# Patient Record
Sex: Female | Born: 1950 | Race: White | Hispanic: No | Marital: Married | Smoking: Never smoker
Health system: Southern US, Community
[De-identification: ages and names within clinical notes are randomized; demographics above are authoritative.]

## PROBLEM LIST (undated history)

## (undated) DIAGNOSIS — J189 Pneumonia, unspecified organism: Secondary | ICD-10-CM

## (undated) DIAGNOSIS — I10 Essential (primary) hypertension: Secondary | ICD-10-CM

## (undated) HISTORY — PX: TUBAL LIGATION: SHX77

## (undated) HISTORY — PX: VIDEO ASSISTED THORACOSCOPY (VATS)/DECORTICATION: SHX6171

---

## 1999-06-06 ENCOUNTER — Other Ambulatory Visit: Admission: RE | Admit: 1999-06-06 | Discharge: 1999-06-06 | Payer: Self-pay | Admitting: Internal Medicine

## 2000-09-18 ENCOUNTER — Encounter: Admission: RE | Admit: 2000-09-18 | Discharge: 2000-09-18 | Payer: Self-pay | Admitting: Internal Medicine

## 2000-09-18 ENCOUNTER — Encounter: Payer: Self-pay | Admitting: Internal Medicine

## 2000-10-23 ENCOUNTER — Other Ambulatory Visit: Admission: RE | Admit: 2000-10-23 | Discharge: 2000-10-23 | Payer: Self-pay | Admitting: *Deleted

## 2005-04-10 ENCOUNTER — Inpatient Hospital Stay (HOSPITAL_COMMUNITY): Admission: EM | Admit: 2005-04-10 | Discharge: 2005-05-05 | Payer: Self-pay | Admitting: Emergency Medicine

## 2005-04-12 ENCOUNTER — Encounter (INDEPENDENT_AMBULATORY_CARE_PROVIDER_SITE_OTHER): Payer: Self-pay | Admitting: Interventional Cardiology

## 2005-04-13 ENCOUNTER — Ambulatory Visit: Payer: Self-pay | Admitting: Internal Medicine

## 2005-04-14 ENCOUNTER — Encounter (INDEPENDENT_AMBULATORY_CARE_PROVIDER_SITE_OTHER): Payer: Self-pay | Admitting: *Deleted

## 2005-04-21 ENCOUNTER — Encounter (INDEPENDENT_AMBULATORY_CARE_PROVIDER_SITE_OTHER): Payer: Self-pay | Admitting: Specialist

## 2005-04-23 ENCOUNTER — Encounter (INDEPENDENT_AMBULATORY_CARE_PROVIDER_SITE_OTHER): Payer: Self-pay | Admitting: *Deleted

## 2005-05-25 ENCOUNTER — Encounter: Admission: RE | Admit: 2005-05-25 | Discharge: 2005-05-25 | Payer: Self-pay | Admitting: Thoracic Surgery

## 2005-06-23 ENCOUNTER — Encounter: Admission: RE | Admit: 2005-06-23 | Discharge: 2005-06-23 | Payer: Self-pay | Admitting: Thoracic Surgery

## 2005-09-05 ENCOUNTER — Encounter: Admission: RE | Admit: 2005-09-05 | Discharge: 2005-09-05 | Payer: Self-pay | Admitting: Thoracic Surgery

## 2006-11-06 IMAGING — CT CT ANGIO CHEST
1 of 6 series · 14 of 30 positions shown · IV contrast (120 ML OMNI 300)
Comparison: none

CLINICAL DATA: Shortness of breath.  Chest pain.  Question pulmonary embolus.
 CT CHEST ANGIO PULMONARY EMBOLUS PROTOCOL:
 Multidetector helical study performed during IV contrast enhancement with 120 cc of Omnipaque 300.  Coronal and sagittal reformations were performed.  There are extensive bilateral infiltrative changes, particularly involving the lower lobes (left greater than right).  In addition, there is a left pleural effusion present with a smaller right pleural effusion, and there are areas of probable loculation associated with the pleural effusions bilaterally.  There is also a 1.6 cm in size nodule seen within the posterior right lower lobe (image #55).  I do recommend follow-up chest CT scan after treatment with attention to this area.  This may represent either a nodular infiltrate or a tumor.  There is also a smaller (5 mm) nodule within the right upper lobe in a subpleural location (image #32).  This should also be re-examined on subsequent chest CT.  There is no CT scan evidence for pulmonary emboli.  There is no mediastinal or hilar adenopathy.

[Series 2: pe · axial · 0.70mm/px · z∈[-214,-17]mm · 14 of 370 slices shown]
[im 27/370  lung]
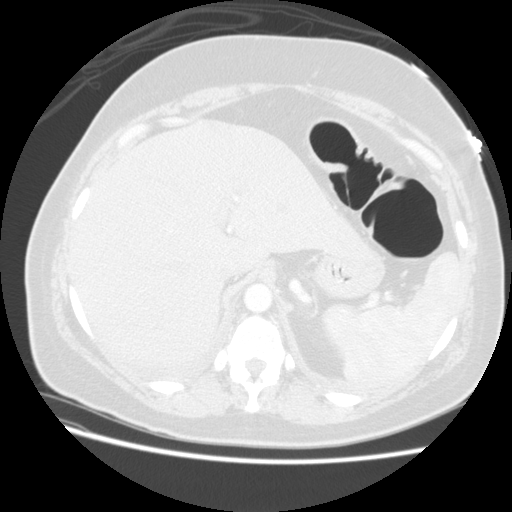
[im 53/370  mediastinal]
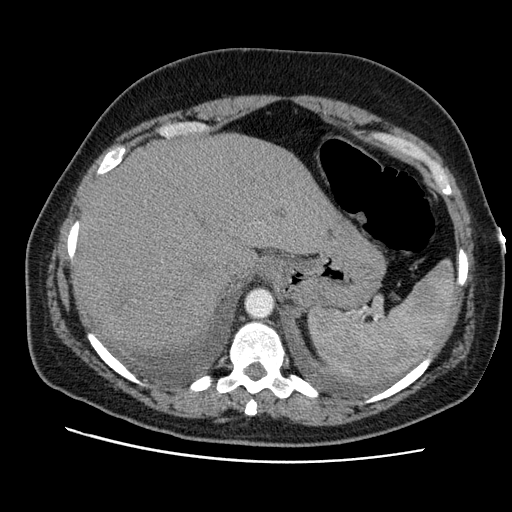
[im 80/370  lung]
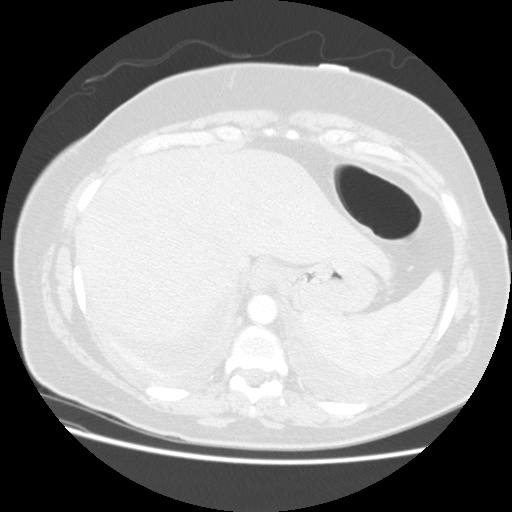
[im 106/370  mediastinal]
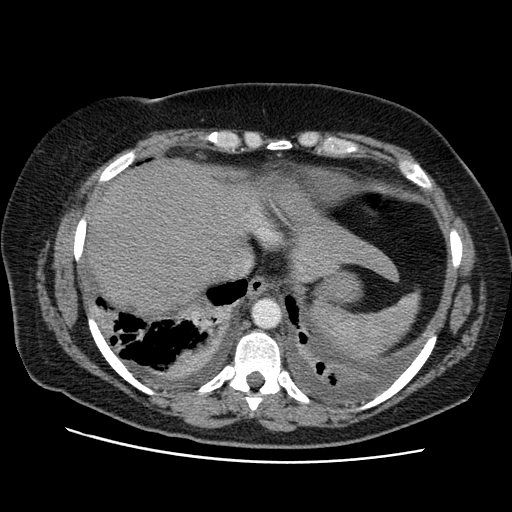
[im 132/370  lung]
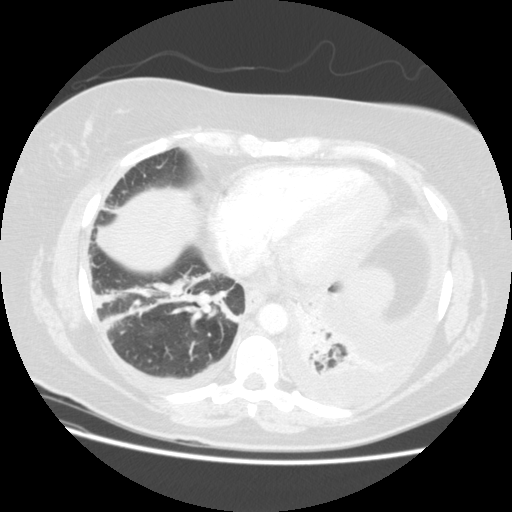
[im 159/370  mediastinal]
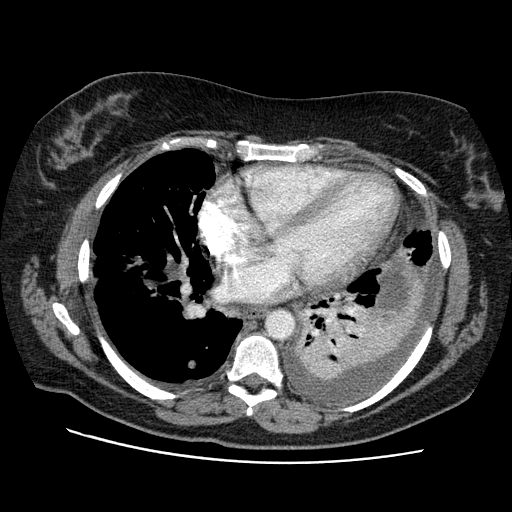
[im 174/370  lung]
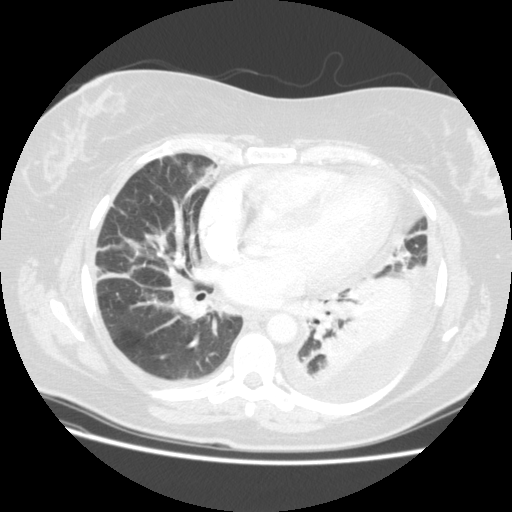
[im 185/370  mediastinal]
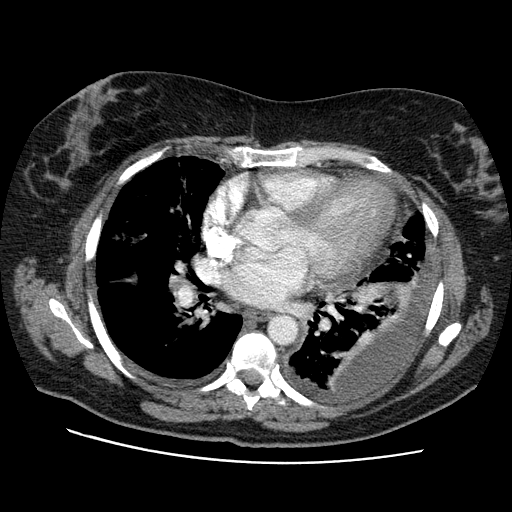
[im 211/370  lung]
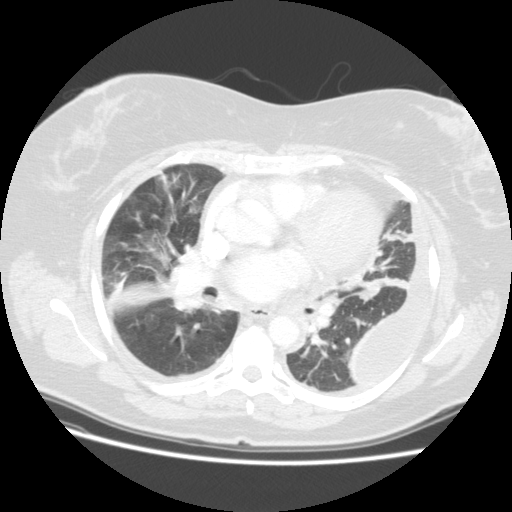
[im 238/370  mediastinal]
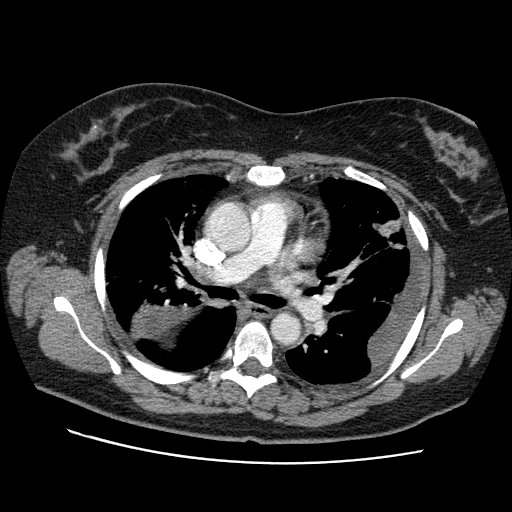
[im 264/370  lung]
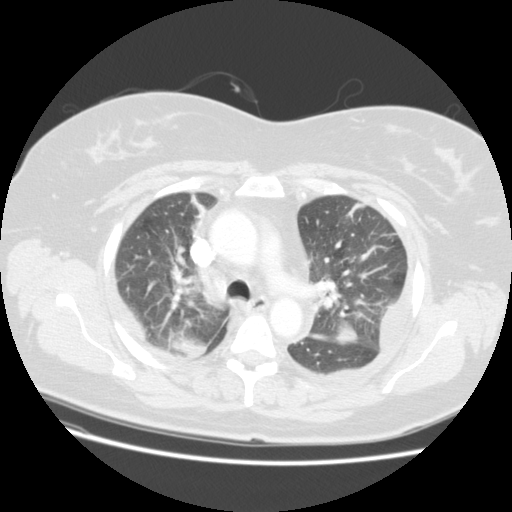
[im 290/370  mediastinal]
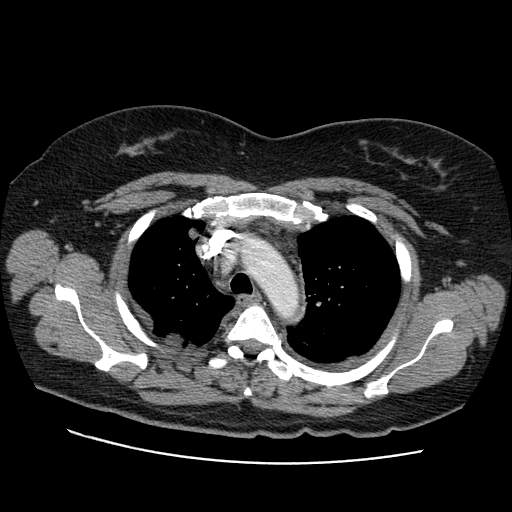
[im 317/370  lung]
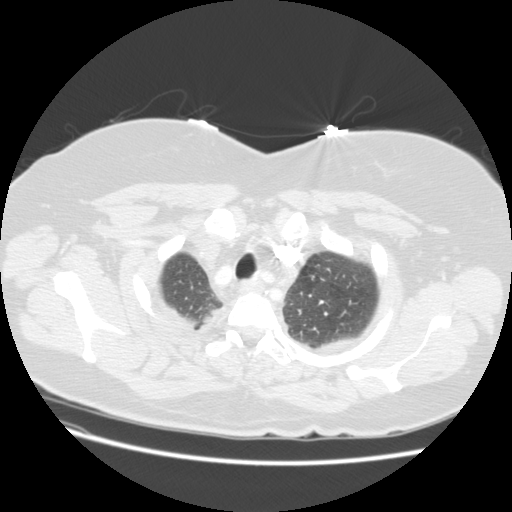
[im 343/370  mediastinal]
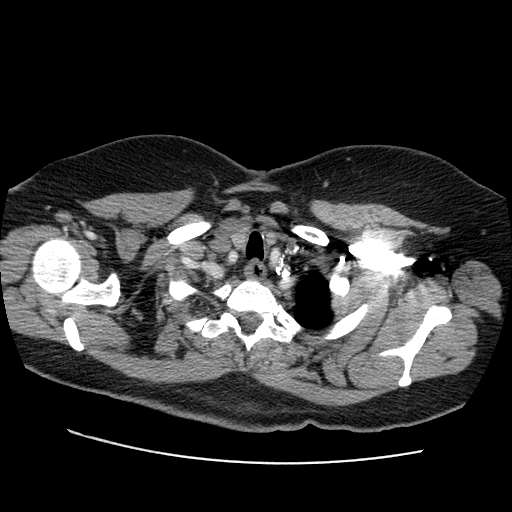

[14 of 30 positions shown; findings below may reference images not displayed]

IMPRESSION: 1.  No CT scan evidence for pulmonary emboli.
 2.  Extensive bilateral infiltrates, particularly involving the left lower lobe.
 3.  1.6 cm in size right lower lobe nodule and 5 mm in size subpleural right upper lobe nodule.  These areas should be followed up following treatment on subsequent chest CT scans.
 4.  Bilateral pleural effusions with probable areas of loculation.

## 2008-07-16 ENCOUNTER — Other Ambulatory Visit: Admission: RE | Admit: 2008-07-16 | Discharge: 2008-07-16 | Payer: Self-pay | Admitting: Internal Medicine

## 2010-02-01 ENCOUNTER — Other Ambulatory Visit: Admission: RE | Admit: 2010-02-01 | Discharge: 2010-02-01 | Payer: Self-pay | Admitting: Internal Medicine

## 2010-09-07 ENCOUNTER — Encounter: Admission: RE | Admit: 2010-09-07 | Discharge: 2010-09-07 | Payer: Self-pay | Admitting: Internal Medicine

## 2010-12-17 ENCOUNTER — Encounter: Payer: Self-pay | Admitting: Thoracic Surgery

## 2010-12-18 ENCOUNTER — Encounter: Payer: Self-pay | Admitting: Thoracic Surgery
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
## 2011-04-14 NOTE — Consult Note (Signed)
NAME:  [name], RIEKE NO.:  [id number]   MEDICAL RECORD NO.:  [id number]          PATIENT TYPE:  INP   LOCATION:  6704                         FACILITY:  [facility]   PHYSICIAN:  Meade Maw, M.D.    DATE OF BIRTH:  Jul 02, 1951   DATE OF CONSULTATION:  05/03/2005  DATE OF DISCHARGE:                                   CONSULTATION   PRIMARY CARE PHYSICIAN:  Dr. Robley Fries.   INDICATION FOR STUDY:  Evaluation of MRSA for possible bacterial  endocarditis.   HISTORY:  [name] is a 60 year old unfortunate female who was admitted on May  15th with complaints of pain and fever and found to have a labial cyst  infection.  She was started on Augmentin as an outpatient, had nausea and  vomiting and was admitted for a possible aspiration pneumonia.  She  subsequently developed empyema requiring drainage for CVTS.  She developed  thrombocytopenia secondary to Zosyn and now has been found to develop MRSA  bacteremia.  Her last transthoracic echo on Apr 12, 2005, interpreted by Dr.  Verdis Prime, was suboptimal.  There was grossly normal hemodynamic function  with an ejection fraction of 65-75%, unable to comment on valvular  endocarditis.   PAST MEDICAL HISTORY:  Includes:  1.  Morbid obesity.  2.  Adult onset diabetes, new this admission.  3.  Asthma.  4.  Acute renal failure, new this admission.  5.  Thrombocytopenia secondary to Zosyn.  6.  Bilateral empyema status post VATS.  7.  Vaginal labial abscess as noted above.   REVIEW OF SYSTEM:  Is as noted above.   ALLERGIES:  NO KNOWN DRUG ALLERGIES.   SOCIAL HISTORY:  She is married, works in [employer].  No history of alcohol or  tobacco use.   INPATIENT MEDICATIONS INCLUDE:  1.  Sliding scale insulin.  2.  Nexium 40 mg daily.  3.  __________ supplement.  4.  Vancomycin IV.  5.  Dulcolax suppository.  6.  Aranesp.   PHYSICAL EXAM:  Reveals a middle-aged female currently in no distress,  alert, oriented,  conversation is appropriate.  She is afebrile.  Systolic  pressure running 170-180/60, heart rate 86, O2 sats ranging 85-89% on room  air.  PULMONARY EXAM:  Reveals breath sounds which are slightly diminished at the  base, no wooziness noted.  CARDIOVASCULAR EXAM:  Reveals a regular rate and rhythm, no audible murmur  is noted, no bruits are noted.  ABDOMEN:  Soft, nontender.  EXTREMITIES:  Reveal no peripheral edema, distal pulses are equal and  palpable.   LABORATORY DATA:  White count was 12.6, hematocrit 28.1, platelet count 226,  sodium 144, potassium 3.5, creatinine is 3.2 and has decreased from 5.4.  ECG reveals low voltage, slight R wave progression.  There is nonspecific ST  changes.  Blood culture was positive for MSRA as noted above.   IMPRESSION:  1.  Staphylococcus bacteremia.  Will schedule for a transesophageal      echocardiogram, this has been scheduled for May 05, 2005, at 2 p.m.,      this is the first  slot available.  She may have a clear liquid breakfast      at 6 a.m., but otherwise n.p.o.  2.  Hypertension, blood pressure currently is not well controlled.  May need      to consider adding a calcium channel blocker, would avoid Ace inhibitors      at this time in view of renal insufficiency.  3.  Acute renal insufficiency, creatinine is gradually improving.       HP/MEDQ  D:  05/03/2005  T:  05/03/2005  Job:  [id number]

## 2011-04-14 NOTE — H&P (Signed)
NAME:  YANELLE, SOUSA NO.:  [id number]   MEDICAL RECORD NO.:  [id number]          PATIENT TYPE:  INP   LOCATION:  3729                         FACILITY:  [facility]   PHYSICIAN:  Candyce Churn, M.D.DATE OF BIRTH:  [birth year]   DATE OF ADMISSION:  04/10/2005  DATE OF DISCHARGE:                                HISTORY & PHYSICAL   CHIEF COMPLAINT:  Chest pain and fever.   HISTORY OF PRESENT ILLNESS:  Ms. Kiger is a pleasant 60 year old female  with a history of (1) mild obesity, (2) hypercholesterolemia, (3) seasonal  allergies.  She presents with a 2-day history of nausea and vomiting after  having started Augmentin and oxycodone and Tylenol for pain and fever, felt  to be secondary to an infected left labial cyst in the genital area.   The patient had developed some pain and swelling on Thursday of last week,  and on Friday had the left vaginal labial cyst drained, and was started on  Augmentin and the oxycodone with Tylenol.  She developed nausea and vomiting  following this, multiple times the day before yesterday.  Because she was  not eating well and she continued to be febrile, she went to the  [facility] walk-in clinic today, and then was transferred to [facility]  emergency room for abnormal chest x-ray and borderline hypoxia.  She is also  having intense pleuritic chest pain in her anterior chest, right greater  than left.  O2 saturations on room air have been 90%.  Chest x-ray is  consistent with infiltrates bilaterally in the lower lobes.   CURRENT MEDICATIONS:  1.  Oxycodone/APAP 10/325 one-half to one every 4 hours as needed.  2.  Augmentin XR 1000/62.5 mg one p.o. b.i.d.   ALLERGIES:  No known drug allergies.   FAMILY HISTORY:  Significant for hypertension, diabetes, COPD, and colon  cancer.   SOCIAL HISTORY:  The patient is married and works in the lab at __________.  No tobacco use.  No alcohol use.  Husband is present and very  supportive -  Zuriah Bordas, who is also a patient of mine.   REVIEW OF SYSTEMS:  Denies headache.  She does have rather intense pleuritic  chest pain.  Denies any choking with her nausea or vomiting over the  weekend.  No abdominal pain currently.  No bright red blood per rectum.  The  patient is currently having her period, which is the first time she has had  it in 6 months.  Extremities without pain or swelling.   PHYSICAL EXAMINATION:  GENERAL:  She is alert and oriented, but complaining  of pain with inspiration in her anterior chest.  VITAL SIGNS:  Blood pressure is 138/64, temperature 99.2 after Tylenol,  pulse 98 and regular, respiratory rate 24 and hindered by pain with  respiration.  She is 99% on two liters of oxygen nasal cannula in terms of  her O2 saturation.  HEENT:  Atraumatic and normocephalic.  Oropharynx is clear.  Mucous  membranes are only minimally moist at best.  No posterior injection.  NECK:  Supple without JVD or thyromegaly.  Carotids were 2+ bilaterally.  No  lymphadenopathy noted.  CHEST:  Coarse breath sounds bilaterally, but no obvious rales present.  CARDIAC:  Regular rhythm.  No murmurs or gallops.  ABDOMEN:  Soft, obese, nontender.  Bowel sounds are normal.  PELVIC:  Left labial majora has a small area of exudate, approximately 0.5 x  0.5 cm, along the mid labia.  There is minimal erythema and fluctuance at  best.  Really, the left labia majora is about the same size as the right.  Other than a small spot of purulence, it looked relatively normal.  The  patient states that it is much improved.  EXTREMITIES:  Without clubbing, cyanosis, or edema.  There are no rashes  diffusely.   Chest x-ray reveals bilateral infiltrates and elevated diaphragm, left  greater than right.  EKG reveals sinus tachycardia at 104.  No ST segment  elevation or depression.  No right axis deviation.  LFT's are normal, except  for total bilirubin elevated at 1.6.  Glucose is  113, creatinine 1.2, BUN  27.  CBC reveals a white count of 14,100, hemoglobin 14, and platelet count  of 165,000.   ASSESSMENT:  A 60 year old female with bilobar infiltrates in the left lower  lobe and fever.  Wonder if this may be aspiration with nausea and vomiting  over the weekend.  Also worried about bilateral pulmonary emboli.   PLAN:  1.  Will check D-dimers, and if elevated, will check chest CT.  2.  For community acquired bacterial pneumonia, will cover with IV Rocephin      and azithromycin, and treat with nebulizer therapy, IV fluids, and nasal      cannula oxygen to keep O2 saturations above 90%.  3.  Will go ahead and start Lovenox with a one-time dose of 80 mg      subcutaneous of Lovenox, and if pulmonary embolus is ruled out, will      continue subcutaneous Lovenox 40 mg daily until the patient is more      ambulatory and active.       RNG/MEDQ  D:  04/10/2005  T:  04/10/2005  Job:  [id number]

## 2011-04-14 NOTE — Op Note (Signed)
[name], [name] NO.:  [id number]   MEDICAL RECORD NO.:  [id number]          PATIENT TYPE:  INP   LOCATION:  2550                         FACILITY:  [facility]   PHYSICIAN:  Ines Bloomer, M.D. DATE OF BIRTH:  October 13, 1951   DATE OF PROCEDURE:  04/21/2005  DATE OF DISCHARGE:                                 OPERATIVE REPORT   PREOPERATIVE DIAGNOSIS:  Bilateral empyemas.   POSTOPERATIVE DIAGNOSIS:  Bilateral empyemas.   OPERATION PERFORMED:  Left and right video-assisted video thoracoscopy,  drainage of empyema, mini-thoracotomy, decortication, and lung biopsy.   Under general anesthesia, the patient had a dual-lumen tube inserted, was  turned in the left lateral thoracotomy position, was prepped and draped in  the usual sterile manner.  Two trocar sites were made in the anterior and  posterior axillary line at the eighth intercostal space.  Two trocars were  inserted and the empyema cavity was entered.  There was a marked amount of  reaction around the left lower lobe.  Pictures were taken and cultures were  taken, but the intense reaction required a mini-thoracotomy so a 4-5 cm  incision was made and the sixth intercostal space was entered.  After  partially dividing the latissimus, a small retractor was inserted.  We freed  up the left upper lobe and then freed up the left lower lobe with sharp and  blunt dissection.  The fissure was then freed up.  We had to partially  divide the fissure with an EZ-45 stapler.  After this had been done, in the  lingula of the left upper lobe there was some peel, and this was removed  with sharp and blunt dissection using Kitners and forceps to remove the  peel.  Then the attention was turned to the left upper lobe and the peel was  really in the basilar segments.  The superior segments appeared to be  satisfactory and several peels of the left upper lobe were done.  The  decortication of the left lower lobe was done,  removing the thickened peel.  However, in the medial basilar segment there was an obvious abscess and this  was resected with the Echelon 60 stapler and the EZ-45 stapler.  A frozen  section revealed an abscess and not cancer.  The rest of the peel was  removed from the left lower lobe and all necrotic debris was removed from  the pleura, and all thickened pleura was removed.  Three chest tubes were  placed, two to the trocar sites, anterior and posterior 28 chest tubes, and  then a right angle chest tube was placed between these and sutured in place  with 0 silk.  The right angle chest tube was placed to the diaphragm.  On-Q  catheters were placed in the intercostal space, and the intercostal space  was closed with two paracostals of #1 Vicryl in the muscle layers, 2-0  Vicryl in the subcutaneous tissue and Ethicon skin clips.  The patient was  then turned to the left lateral thoracotomy position and prepped and draped  in the usual sterile manner.  The lung was  deflated.  Two trocar sites were  made in the anterior and posterior axillary line at the eighth intercostal  space.  Two trocars were inserted, and again there was a marked peel on the  left lower lobe, but this involved the posterior basilar segments.  There  was some mild involvement of the medial basilar segments.  In the sixth  intercostal space, a 3-4 cm incision was made and a small retractor was  inserted, and the right lower lobe was freed up and a culture sent from the  fluid as well as from the debris, and all debris was removed from the  pleura, particularly in the right posterior costophrenic angle.  After all  necrotic debris had been removed, decortication was done of the right lower  lobe, removing the thickened peel with  sharp and blunt dissection.  Three chest tubes were then placed, two  straight 28's and a right angle 28, and the chest was closed with two  paracostals of #1 Vicryl in the muscle layer and  Ethicon skin clips.  The  patient returned to the recovery room in serious condition.      DPB/MEDQ  D:  04/21/2005  T:  04/22/2005  Job:  [id number]   cc:   Shan Levans, M.D. [facility]   Candyce Churn, M.D.  [address] [place]  [place] [address]  Fax: [phone]

## 2011-04-14 NOTE — Discharge Summary (Signed)
[name], [name] NO.:  [id number]   MEDICAL RECORD NO.:  [id number]          PATIENT TYPE:  INP   LOCATION:  6704                         FACILITY:  [facility]   PHYSICIAN:  Candyce Churn, M.D.DATE OF BIRTH:  Apr 14, 1951   DATE OF ADMISSION:  04/10/2005  DATE OF DISCHARGE:  05/05/2005                                 DISCHARGE SUMMARY   DISCHARGE DIAGNOSES:  1.  Bilateral aspiration pneumonia with bilateral empyemas.  2.  Bilateral chest tube placement per Dr. Karle Plumber for treatment of      empyema.  3.  Methicillin resistant staph aureus sepsis with need for post hospital      intravenous vancomycin therapy for 4 weeks.  4.  Acute renal failure following bilateral video-assisted thoracic surgery      for treatment of empyema, resolved.  5.  Hypokalemia, resolved.  6.  Aortic sclerosis.  7.  Thrombocytopenia secondary to Zosyn.  8.  Asthma.  9.  Pre-hospital drainage of a vaginal labial abscess.   CONSULTATIONS:  1.  Meade Maw, M.D. to rule out methicillin resistant staph aureus      bacterial endocarditis.  2.  D. Karle Plumber, M.D. for bilateral empyemas with subsequent video-      assisted thoracic surgery procedure on both lungs.  3.  Beryle Lathe, M.D., Nephrology, for acute renal failure with oliguria      and rapidly rising creatinine on Apr 23, 2005.   PROCEDURE:  1.  Bilateral video assisted thoracostomy and drainage of empyemas per Dr.      Karle Plumber on Apr 20, 2005. Cytology was negative for tumor cells      and cultures were negative for growth. Wedge resection of left lower      lobe revealed subpleural fibrosis inflammation with a surface fibrinous      exudate with acute inflammation. No malignant cells noted.  2.  Thoracentesis on Apr 14, 2005 revealed rare reactive mesothelial cells      with mixed acute chronic inflammation. No evidence of malignancy.  3.  Transthoracic echocardiogram performed Apr 12, 2005.  Revealed ejection      fraction of 75% to 85%. Left ventricular wall thickness normal. No      evidence of regional wall motion abnormalities. The aortic valve      thickness was mildly to moderately increased.   DISCHARGE LABORATORY DATA:  On May 05, 2005 - white cell count 12,000.  Hemoglobin 9.1. Platelet count 279,000. Sed rate of admission was 11 and on  Apr 24, 2005 was 75. PT on Apr 20, 2005 was 13.8 and PTT 33. Electrolytes on  discharge, May 05, 2005 revealed sodium 144, potassium 3.4, chloride 108,  bicarb 28, glucose 91, BUN 28, creatinine 2.8 with a peak of 6.2 on April 27, 2005. Calcium on discharge 8.0. Liver function studies on May 01, 2005  revealed an AST, ALT, and alkaline phosphatase normal at 15, 21, and 55  respectively. Total bilirubin 0.2, magnesium 2.0, phosphorus 5.0. A TSH on  Apr 23, 2005 was 5.303. Cortisol on Apr 24, 2005 was 12.2 - within normal  range. CA125 on Apr 14, 2005 was 30 - within normal range. Pleural fluid  from Apr 14, 2005 was amber and cloudy with 90% neutrophils. LDH was 638,  elevated. Urinalysis on April 29, 2005 revealed cloudy urine with specific  gravity of 1.008, pH of 5, moderate hemoglobin, moderate leukocyte esterase,  rare epithelial cells, 7 to 10 white cells, and 3 to 10 red cells with rare  bacteria. Blood cultures from April 29, 2005 grew methicillin resistant staph  aureus sensitive to gentamycin, rifampin, tetracycline, trimethoprim sulfa,  and vancomycin. Body fluid cultures from Apr 14, 2005 from the pleural  effusions revealed no growth at 3 days. Repeat left pleural fluid culture on  Apr 21, 2005 also revealed no growth at 3 days.  A urine culture from April 29, 2005 grew methicillin resistant staph aureus with the same sensitivities  as the blood cultures. C-diff toxin from stool sample on Apr 20, 2005 was  negative for C-diff. AFB culture and smear revealed no acid fast bacilli on  Apr 14, 2005 from pleural fluid aspirate.  Pleural fluid from Apr 21, 2005  AFB smears were negative x3 and cultures are still pending.   HOSPITAL COURSE:  The patient is a very pleasant 60 year old female who  presented on Apr 10, 2005 with complaint of 2 days of nausea and vomiting  after having started Augmentin, oxycodone, and Tylenol for pain and fever,  thought to be secondary to an infected labial cyst in the genital area. The  patient had developed some pain and swelling in the labial region, during  the week prior to admission and had the cyst drained several days prior to  admission. After receiving prescriptions for Augmentin, oxycodone, and  Tylenol, she felt nausea and vomited multiple times prior to her coming to  the emergency room on Apr 10, 2005. She was found to have borderline hypoxia  and bilateral infiltrates on chest x-ray. She had infiltrates in both lower  lobes. She was admitted and felt to have bilateral lower lobe aspiration  pneumonia.   HOSPITAL COURSE BY PROBLEM:  PROBLEM #1:  PULMONARY:  The patient continued to have persistent fever and  severe pleuritic chest pain during the first several days of admission. X-  rays were not improving during the first 3 to 4 days of admission and  pulmonary consultation was obtained, recommending thoracentesis of the  effusions. Initial antibiotics were Rocephin and azithromycin. This was  switched to intravenous Avelox. On Apr 14, 2005, ultrasound guided  thoracentesis and evaluation revealed multiple septations in the effusions,  suggesting loculation. The patient was seen in consultation by Dr. Karle Plumber on Apr 15, 2005 and because of persistent fevers, went on to video-  assisted thoracic surgery procedure on Apr 21, 2005. At time of the video-  assisted thoracic surgery, bilateral pleural empyemas were noted and chest  tubes were placed. Culture stains for AFB and culture and cytology were obtained. A decortication procedure had to be performed at the  time of video-  assisted thoracic surgery on Apr 21, 2005 secondary to empyemas.   PROBLEM #2:  ACUTE RENAL FAILURE:  Following her video-assisted thoracic  surgery procedure over the ensuring 48 to 72 hours, the pateint developed  acute renal failure felt to be secondary to dehydration. She was seen in  consultation by Dr. Fayrene Fearing Deterding, who recommended hydration with normal  saline and renal ultrasound and felt like the acute renal failure was likely  secondary to transient hypotension.  Her renal failure was resolving by the  time of discharge. She never required dialysis.   PROBLEM #3:  FEVER:  Toward the end of her hospitalization, she did develop  fever versus urinary tract infection and she had a positive blood culture  for methicillin resistant staph aureus and transesophageal echocardiography  was negative for vegetations  and she received 4 more weeks of vancomycin,  status post discharge.   CONDITION ON DISCHARGE:  Much improved. The patient was able to ambulate  without dyspnea and O2 saturations were above 90% on room air.   FOLLOW UP:  The patient will followup with Dr. Edwyna Shell in 2 weeks in his  office and my office in 1 week.     Candyce Churn, M.D.  Electronically Signed    RNG/MEDQ  D:  08/26/2005  T:  08/26/2005  Job:  [id number]

## 2012-06-11 ENCOUNTER — Other Ambulatory Visit: Payer: Self-pay | Admitting: Internal Medicine

## 2012-06-11 ENCOUNTER — Other Ambulatory Visit (HOSPITAL_COMMUNITY)
Admission: RE | Admit: 2012-06-11 | Discharge: 2012-06-11 | Disposition: A | Discharge status: Home / Self Care | Payer: 59 | Source: Ambulatory Visit | Attending: Internal Medicine | Admitting: Internal Medicine

## 2012-06-11 DIAGNOSIS — Z01419 Encounter for gynecological examination (general) (routine) without abnormal findings: Secondary | ICD-10-CM | POA: Insufficient documentation

## 2012-11-07 ENCOUNTER — Other Ambulatory Visit: Payer: Self-pay | Admitting: Internal Medicine

## 2012-11-07 DIAGNOSIS — Z1231 Encounter for screening mammogram for malignant neoplasm of breast: Secondary | ICD-10-CM

## 2012-12-18 ENCOUNTER — Ambulatory Visit: Payer: 59

## 2013-06-04 ENCOUNTER — Ambulatory Visit
Admission: RE | Admit: 2013-06-04 | Discharge: 2013-06-04 | Disposition: A | Discharge status: Home / Self Care | Payer: 59 | Source: Ambulatory Visit | Attending: Internal Medicine | Admitting: Internal Medicine

## 2013-06-04 DIAGNOSIS — Z1231 Encounter for screening mammogram for malignant neoplasm of breast: Secondary | ICD-10-CM

## 2015-06-02 ENCOUNTER — Other Ambulatory Visit: Payer: Self-pay | Admitting: Gastroenterology

## 2015-08-17 ENCOUNTER — Encounter (HOSPITAL_COMMUNITY): Payer: Self-pay | Admitting: *Deleted

## 2015-08-24 ENCOUNTER — Ambulatory Visit (HOSPITAL_COMMUNITY): Payer: Medicare Other | Admitting: Certified Registered Nurse Anesthetist

## 2015-08-24 ENCOUNTER — Encounter (HOSPITAL_COMMUNITY)
Admission: RE | Disposition: A | Discharge status: Home / Self Care | Payer: Self-pay | Source: Ambulatory Visit | Attending: Gastroenterology

## 2015-08-24 ENCOUNTER — Encounter (HOSPITAL_COMMUNITY): Payer: Self-pay

## 2015-08-24 ENCOUNTER — Ambulatory Visit (HOSPITAL_COMMUNITY)
Admission: RE | Admit: 2015-08-24 | Discharge: 2015-08-24 | Disposition: A | Discharge status: Home / Self Care | Payer: Medicare Other | Source: Ambulatory Visit | Attending: Gastroenterology | Admitting: Gastroenterology

## 2015-08-24 DIAGNOSIS — Z800 Family history of malignant neoplasm of digestive organs: Secondary | ICD-10-CM | POA: Diagnosis not present

## 2015-08-24 DIAGNOSIS — Z1211 Encounter for screening for malignant neoplasm of colon: Secondary | ICD-10-CM | POA: Diagnosis not present

## 2015-08-24 DIAGNOSIS — E780 Pure hypercholesterolemia: Secondary | ICD-10-CM | POA: Diagnosis not present

## 2015-08-24 DIAGNOSIS — I10 Essential (primary) hypertension: Secondary | ICD-10-CM | POA: Insufficient documentation

## 2015-08-24 HISTORY — DX: Essential (primary) hypertension: I10

## 2015-08-24 HISTORY — PX: COLONOSCOPY WITH PROPOFOL: SHX5780

## 2015-08-24 HISTORY — DX: Pneumonia, unspecified organism: J18.9

## 2015-08-24 SURGERY — COLONOSCOPY WITH PROPOFOL
Anesthesia: General

## 2015-08-24 MED ORDER — PROPOFOL 10 MG/ML IV BOLUS
INTRAVENOUS | Status: AC
  Filled 2015-08-24: qty 20

## 2015-08-24 MED ORDER — SODIUM CHLORIDE 0.9 % IV SOLN: INTRAVENOUS | Status: DC

## 2015-08-24 MED ORDER — ONDANSETRON HCL 4 MG/2ML IJ SOLN
INTRAMUSCULAR | Status: DC | PRN
  Administered 2015-08-24: 4 mg via INTRAVENOUS

## 2015-08-24 MED ORDER — LIDOCAINE HCL (CARDIAC) 20 MG/ML IV SOLN
INTRAVENOUS | Status: DC | PRN
  Administered 2015-08-24: 50 mg via INTRAVENOUS

## 2015-08-24 MED ORDER — LIDOCAINE HCL (CARDIAC) 20 MG/ML IV SOLN
INTRAVENOUS | Status: AC
  Filled 2015-08-24: qty 5

## 2015-08-24 MED ORDER — LACTATED RINGERS IV SOLN
INTRAVENOUS | Status: DC
  Administered 2015-08-24: 1000 mL via INTRAVENOUS

## 2015-08-24 MED ORDER — PROPOFOL 500 MG/50ML IV EMUL
INTRAVENOUS | Status: DC | PRN
  Administered 2015-08-24: 300 ug/kg/min via INTRAVENOUS

## 2015-08-24 MED ORDER — ONDANSETRON HCL 4 MG/2ML IJ SOLN
INTRAMUSCULAR | Status: AC
  Filled 2015-08-24: qty 2

## 2015-08-24 SURGICAL SUPPLY — 22 items

## 2015-08-24 NOTE — Anesthesia Preprocedure Evaluation (Addendum)
Anesthesia Evaluation  Patient identified by MRN, date of birth, ID band Patient awake    Reviewed: Allergy & Precautions, NPO status , Patient's Chart, lab work & pertinent test results  Airway Mallampati: II  TM Distance: >3 FB Neck ROM: Full    Dental   Pulmonary pneumonia,    breath sounds clear to auscultation       Cardiovascular hypertension,  Rhythm:Regular Rate:Normal     Neuro/Psych    GI/Hepatic GI history noted. CE   Endo/Other  negative endocrine ROS  Renal/GU negative Renal ROS     Musculoskeletal   Abdominal   Peds  Hematology   Anesthesia Other Findings   Reproductive/Obstetrics                            Anesthesia Physical Anesthesia Plan  ASA: III  Anesthesia Plan: General   Post-op Pain Management:    Induction: Intravenous  Airway Management Planned: Nasal Cannula  Additional Equipment:   Intra-op Plan:   Post-operative Plan:   Informed Consent: I have reviewed the patients History and Physical, chart, labs and discussed the procedure including the risks, benefits and alternatives for the proposed anesthesia with the patient or authorized representative who has indicated his/her understanding and acceptance.   Dental advisory given  Plan Discussed with: Anesthesiologist and CRNA  Anesthesia Plan Comments:         Anesthesia Quick Evaluation

## 2015-08-24 NOTE — Anesthesia Postprocedure Evaluation (Signed)
  Anesthesia Post-op Note  Patient: [name]  Procedure(s) Performed: Procedure(s): COLONOSCOPY WITH PROPOFOL (N/A)  Patient Location: PACU and Endoscopy Unit  Anesthesia Type:MAC  Level of Consciousness: awake  Airway and Oxygen Therapy: Patient Spontanous Breathing  Post-op Pain: mild  Post-op Assessment: Post-op Vital signs reviewed              Post-op Vital Signs: Reviewed  Last Vitals:  Filed Vitals:   08/24/15 1330  BP: 111/45  Pulse: 68  Temp:   Resp: 21    Complications: No apparent anesthesia complications

## 2015-08-24 NOTE — Transfer of Care (Signed)
Immediate Anesthesia Transfer of Care Note  Patient: [name]  Procedure(s) Performed: Procedure(s): COLONOSCOPY WITH PROPOFOL (N/A)  Patient Location: PACU  Anesthesia Type:MAC  Level of Consciousness:  sedated, patient cooperative and responds to stimulation  Airway & Oxygen Therapy:Patient Spontanous Breathing and Patient connected to face mask oxgen  Post-op Assessment:  Report given to PACU RN and Post -op Vital signs reviewed and stable  Post vital signs:  Reviewed and stable  Last Vitals:  Filed Vitals:   08/24/15 1203  BP: 126/66  Pulse: 74  Temp: 36.6 C  Resp: 17    Complications: No apparent anesthesia complications

## 2015-08-24 NOTE — Anesthesia Postprocedure Evaluation (Signed)
  Anesthesia Post-op Note  Patient: [name]  Procedure(s) Performed: Procedure(s): COLONOSCOPY WITH PROPOFOL (N/A)  Patient Location: PACU and Endoscopy Unit  Anesthesia Type:MAC  Level of Consciousness: awake  Airway and Oxygen Therapy: Patient Spontanous Breathing  Post-op Pain: mild  Post-op Assessment: Post-op Vital signs reviewed              Post-op Vital Signs: Reviewed  Last Vitals:  Filed Vitals:   08/24/15 1340  BP: 123/61  Pulse: 65  Temp:   Resp: 16    Complications: No apparent anesthesia complications

## 2015-08-24 NOTE — Op Note (Signed)
Procedure: Screening colonoscopy. Normal screening colonoscopy performed on 04/08/2008. Father diagnosed with colon cancer under age 65  Endoscopist: Danise Edge  Premedication: Propofol administered by anesthesia  Procedure: The patient was placed in the left lateral decubitus position. Anal inspection and digital rectal exam were normal. The Pentax pediatric colonoscope was introduced into the rectum and advanced to the cecum. A normal-appearing ileocecal valve and appendiceal orifice were identified. Colonic preparation for the exam today was good. Withdrawal time was 12 minutes  Rectum. Normal. Retroflex view of the distal rectum was normal  Sigmoid colon and descending colon. Normal  Splenic flexure. Normal  Transverse colon. Normal  Hepatic flexure. Normal  Ascending colon. Normal  Cecum and ileocecal valve. Normal  Assessment: Normal screening colonoscopy  Recommendation: Schedule repeat screening colonoscopy in 5 years

## 2015-08-24 NOTE — Discharge Instructions (Signed)

## 2015-08-24 NOTE — H&P (Signed)
  Procedure: Screening colonoscopy. Normal screening colonoscopy performed on 04/08/2008. Father diagnosed with colon cancer under age 65  History: The patient is a 64 year old female born Nov 30, 1950. She is scheduled to undergo a repeat screening colonoscopy today.  Past medical history: Hypertension. Hypercholesterolemia. Aortic sclerosis murmur. Bilateral empyema with MRSA sepsis in 2006 requiring bilateral chest tubes.  Exam: The patient is alert and lying comfortably on the endoscopy stretcher. Abdomen is soft and nontender to palpation. Cardiac exam reveals a regular rhythm. Lungs are clear to auscultation.  Plan: Proceed with screening colonoscopy

## 2015-08-26 ENCOUNTER — Encounter (HOSPITAL_COMMUNITY): Payer: Self-pay | Admitting: Gastroenterology

## 2015-11-18 ENCOUNTER — Other Ambulatory Visit: Payer: Self-pay | Admitting: Internal Medicine

## 2015-11-18 ENCOUNTER — Other Ambulatory Visit: Payer: Self-pay

## 2015-11-18 DIAGNOSIS — Z1231 Encounter for screening mammogram for malignant neoplasm of breast: Secondary | ICD-10-CM

## 2015-12-15 ENCOUNTER — Ambulatory Visit
Admission: RE | Admit: 2015-12-15 | Discharge: 2015-12-15 | Disposition: A | Discharge status: Home / Self Care | Payer: Medicare Other | Source: Ambulatory Visit | Attending: Internal Medicine | Admitting: Internal Medicine

## 2015-12-15 DIAGNOSIS — Z1231 Encounter for screening mammogram for malignant neoplasm of breast: Secondary | ICD-10-CM

## 2018-07-17 ENCOUNTER — Other Ambulatory Visit: Payer: Self-pay | Admitting: Internal Medicine

## 2018-07-17 DIAGNOSIS — Z1231 Encounter for screening mammogram for malignant neoplasm of breast: Secondary | ICD-10-CM

## 2018-08-28 ENCOUNTER — Ambulatory Visit
Admission: RE | Admit: 2018-08-28 | Discharge: 2018-08-28 | Disposition: A | Discharge status: Home / Self Care | Payer: PRIVATE HEALTH INSURANCE | Source: Ambulatory Visit | Attending: Internal Medicine | Admitting: Internal Medicine

## 2018-08-28 DIAGNOSIS — Z1231 Encounter for screening mammogram for malignant neoplasm of breast: Secondary | ICD-10-CM
